# Patient Record
Sex: Male | Born: 1985 | Race: Black or African American | Hispanic: No | Marital: Single | Smoking: Current every day smoker
Health system: Southern US, Community
[De-identification: ages and names within clinical notes are randomized; demographics above are authoritative.]

## PROBLEM LIST (undated history)

## (undated) HISTORY — PX: HERNIA REPAIR: SHX51

---
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
## 2006-08-02 IMAGING — CR DG CHEST 2V
1 series · 2 of 2 positions shown · non-contrast
Comparison: none

REASON FOR EXAM: Cough
COMMENTS:

PROCEDURE:     DXR - DXR CHEST PA (OR AP) AND LATERAL  - December 18, 2005  [DATE]
RESULT:     Lungs are clear.  The cardiac silhouette and visualized bony
skeleton are unremarkable.

[Series 1: view not recorded · 0.17mm/px · 2 of 2 slices shown]
[im 1/2]
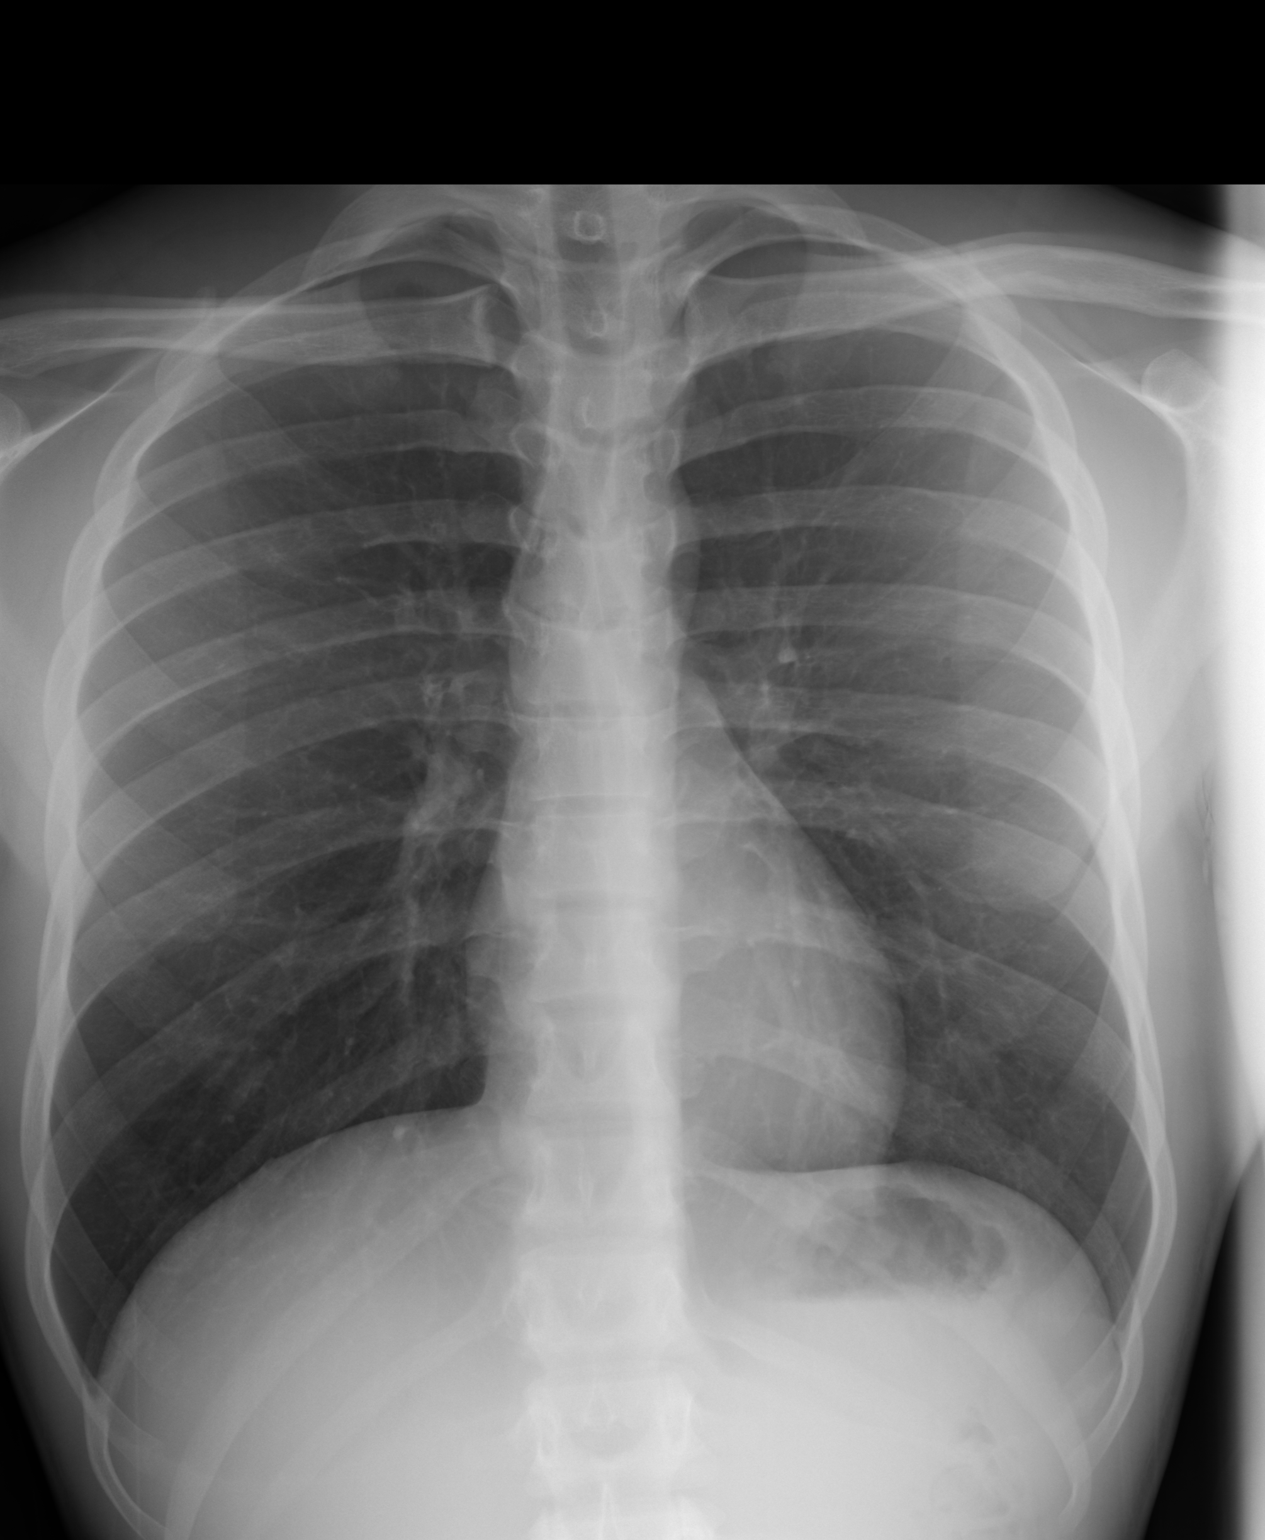
[im 2/2]
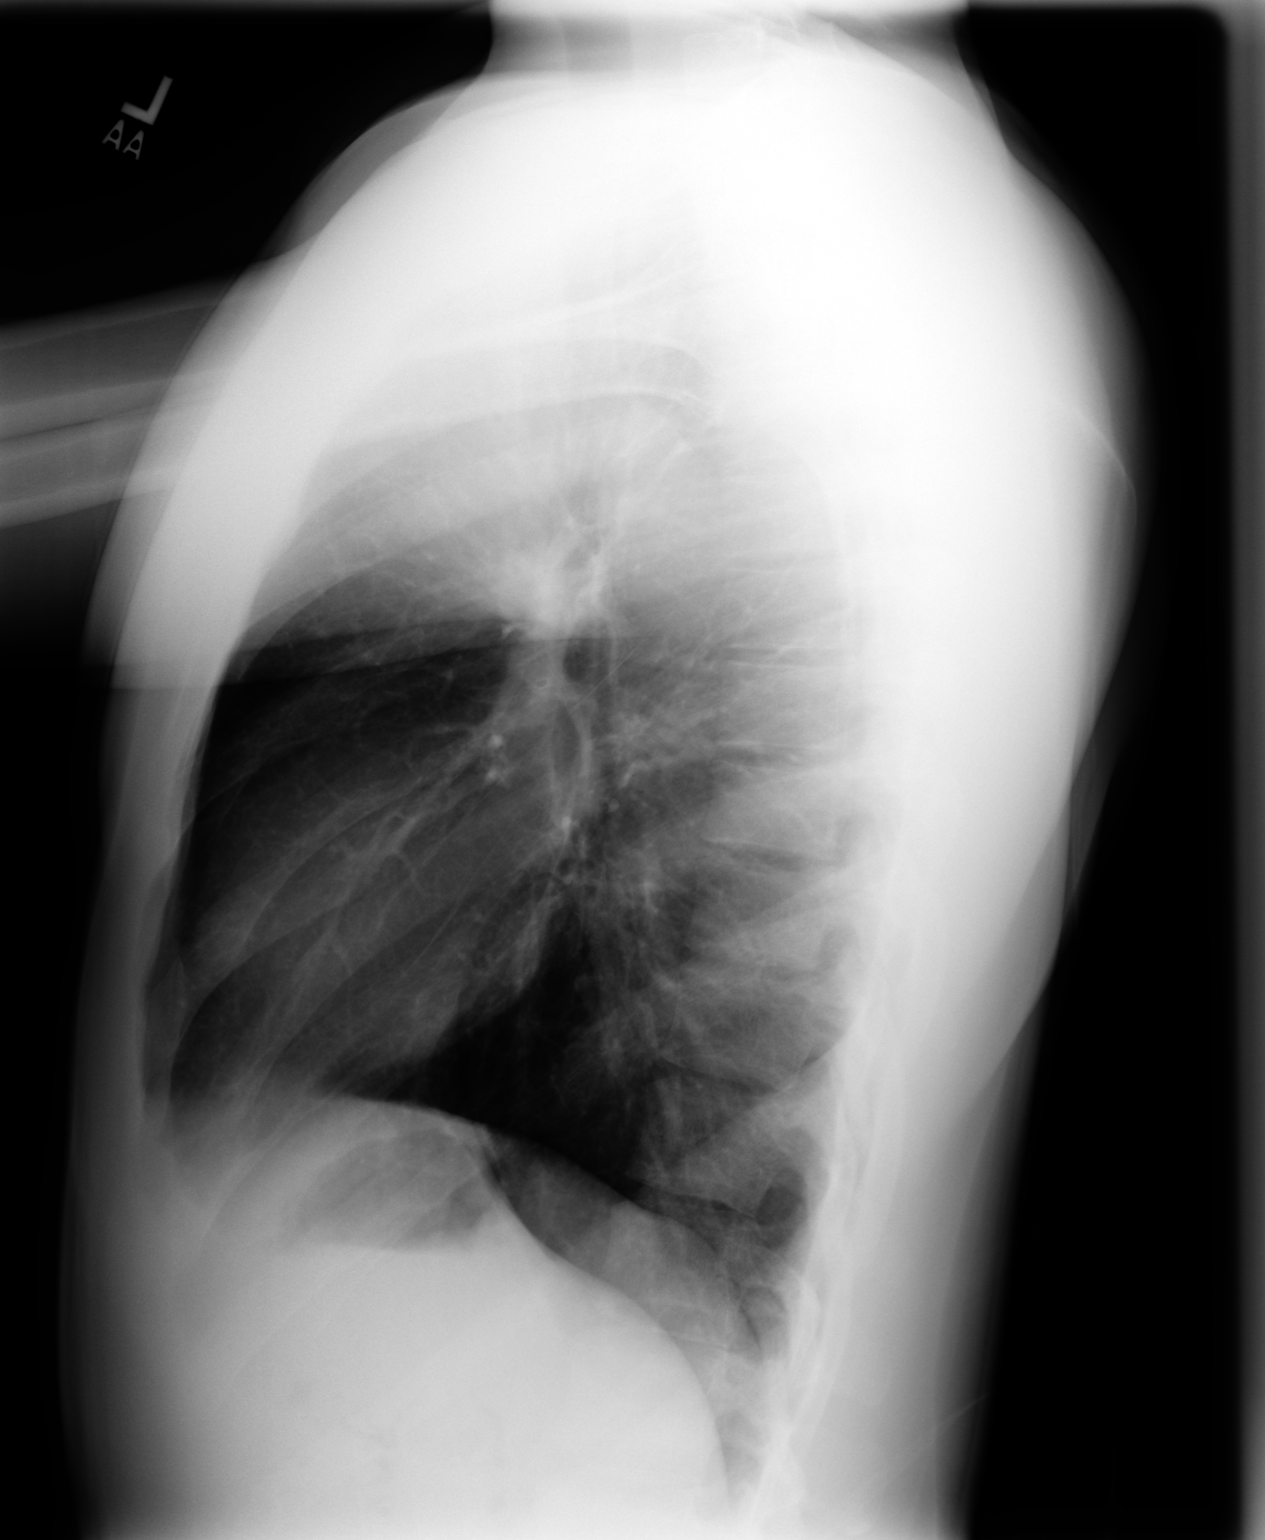

[2 of 2 positions shown; findings below may reference images not displayed]

IMPRESSION: Chest radiograph without evidence of acute cardiopulmonary disease.

## 2006-11-09 ENCOUNTER — Emergency Department: Payer: Self-pay | Admitting: Emergency Medicine

## 2009-07-22 ENCOUNTER — Emergency Department: Payer: Self-pay | Admitting: Unknown Physician Specialty

## 2016-06-13 ENCOUNTER — Emergency Department: Payer: Self-pay

## 2016-06-13 ENCOUNTER — Encounter: Payer: Self-pay | Admitting: Emergency Medicine

## 2016-06-13 DIAGNOSIS — K644 Residual hemorrhoidal skin tags: Secondary | ICD-10-CM | POA: Insufficient documentation

## 2016-06-13 DIAGNOSIS — F17210 Nicotine dependence, cigarettes, uncomplicated: Secondary | ICD-10-CM | POA: Insufficient documentation

## 2016-06-13 LAB — COMPREHENSIVE METABOLIC PANEL
ALK PHOS: 89 U/L (ref 38–126)
ALT: 15 U/L — AB (ref 17–63)
ANION GAP: 8 (ref 5–15)
AST: 28 U/L (ref 15–41)
Albumin: 3.8 g/dL (ref 3.5–5.0)
BUN: 11 mg/dL (ref 6–20)
CALCIUM: 9.6 mg/dL (ref 8.9–10.3)
CHLORIDE: 110 mmol/L (ref 101–111)
CO2: 24 mmol/L (ref 22–32)
CREATININE: 1.07 mg/dL (ref 0.61–1.24)
Glucose, Bld: 103 mg/dL — ABNORMAL HIGH (ref 65–99)
Potassium: 4 mmol/L (ref 3.5–5.1)
Sodium: 142 mmol/L (ref 135–145)
Total Bilirubin: <0.1 mg/dL — ABNORMAL LOW (ref 0.3–1.2)
Total Protein: 6.7 g/dL (ref 6.5–8.1)

## 2016-06-13 LAB — URINALYSIS COMPLETE WITH MICROSCOPIC (ARMC ONLY)
BILIRUBIN URINE: NEGATIVE
Bacteria, UA: NONE SEEN
Glucose, UA: NEGATIVE mg/dL
HGB URINE DIPSTICK: NEGATIVE
LEUKOCYTES UA: NEGATIVE
NITRITE: NEGATIVE
PH: 5 (ref 5.0–8.0)
Protein, ur: 30 mg/dL — AB
SPECIFIC GRAVITY, URINE: 1.03 (ref 1.005–1.030)
Squamous Epithelial / LPF: NONE SEEN

## 2016-06-13 LAB — LIPASE, BLOOD: LIPASE: 50 U/L (ref 11–51)

## 2016-06-13 LAB — CBC
HCT: 40.5 % (ref 40.0–52.0)
Hemoglobin: 13.5 g/dL (ref 13.0–18.0)
MCH: 29.9 pg (ref 26.0–34.0)
MCHC: 33.3 g/dL (ref 32.0–36.0)
MCV: 89.7 fL (ref 80.0–100.0)
PLATELETS: 193 10*3/uL (ref 150–440)
RBC: 4.52 MIL/uL (ref 4.40–5.90)
RDW: 13.4 % (ref 11.5–14.5)
WBC: 8.6 10*3/uL (ref 3.8–10.6)

## 2016-06-13 NOTE — ED Triage Notes (Signed)
Patient ambulatory to triage with steady gait, without difficulty or distress noted; pt reports rectal pain and abd pain x 3 days; last BM 3 days ago after no BM x week

## 2016-06-14 ENCOUNTER — Emergency Department
Admission: EM | Admit: 2016-06-14 | Discharge: 2016-06-14 | Disposition: A | Discharge status: Home / Self Care | Payer: Self-pay | Attending: Emergency Medicine | Admitting: Emergency Medicine

## 2016-06-14 DIAGNOSIS — K644 Residual hemorrhoidal skin tags: Secondary | ICD-10-CM

## 2016-06-14 MED ORDER — TUCKS 50 % EX PADS: 1.0000 "application " | MEDICATED_PAD | CUTANEOUS | 2 refills | Status: AC | PRN

## 2016-06-14 MED ORDER — PRAMOXINE HCL 1 % RE FOAM
1.0000 | Freq: Three times a day (TID) | RECTAL | 0 refills | Status: AC | PRN
Start: 2016-06-14 — End: ?

## 2016-06-14 MED ORDER — DOCUSATE SODIUM 100 MG PO CAPS: 200.0000 mg | ORAL_CAPSULE | Freq: Two times a day (BID) | ORAL | 1 refills | Status: AC

## 2016-06-14 NOTE — ED Notes (Signed)
Pt lying on stretcher, no apparent distress. Dr. Scotty CourtStafford at bedside  States 3-4 days abd pain that is sharp, rectum pain with bleeding. States pain with BM. States has had this before when he was in prison.   Hx of hernia.   Denies vomiting, eating/drinking well.

## 2016-06-14 NOTE — ED Provider Notes (Signed)
[facility] Emergency Department Provider Note  ____________________________________________  Time seen: Approximately 1:27 AM  I have reviewed the triage vital signs and the nursing notes.   HISTORY  Chief Complaint Abdominal Pain    HPI Mario King is a 30 y.o. male who complains of rectal pain for the past 3 days. He notes that he has had chronic issues with constipation, continuing over the last couple weeks. After a hard BM 3 days ago started having rectal pain with some bleeding. Denies nausea or vomiting. Eating and drinking normally. No fevers or chills.  Denies any history of receptive anal intercourse   History reviewed. No pertinent past medical history.   There are no active problems to display for this patient.    Past Surgical History:  Procedure Laterality Date  . HERNIA REPAIR       Prior to Admission medications   Medication Sig Start Date End Date Taking? Authorizing Provider  docusate sodium (COLACE) 100 MG capsule Take 2 capsules (200 mg total) by mouth 2 (two) times daily. 06/14/16   Sharman CheekPhillip Rayquan Amrhein, MD  pramoxine (PROCTOFOAM) 1 % foam Place 1 application rectally 3 (three) times daily as needed for itching or hemorrhoids. 06/14/16   Sharman CheekPhillip Ethelyne Erich, MD  Witch Hazel (TUCKS) 50 % PADS Apply 1 application topically every 2 (two) hours as needed. 06/14/16   Sharman CheekPhillip Tejal Monroy, MD     Allergies Patient has no allergy information on record.   No family history on file.  Social History Social History  Substance Use Topics  . Smoking status: Current Every Day Smoker    Packs/day: 0.50    Types: Cigarettes  . Smokeless tobacco: Never Used  . Alcohol use Yes     Comment: 2-3times/week    Review of Systems  Constitutional:   No fever or chills.  Cardiovascular:   No chest pain. Respiratory:   No dyspnea or cough. Gastrointestinal:   Negative for abdominal pain, vomiting and diarrhea.  10-point ROS otherwise  negative.  ____________________________________________   PHYSICAL EXAM:  VITAL SIGNS: ED Triage Vitals  Enc Vitals Group     BP 06/13/16 2318 (!) 147/100     Pulse Rate 06/13/16 2318 84     Resp 06/13/16 2318 18     Temp 06/13/16 2318 98 F (36.7 C)     Temp Source 06/13/16 2318 Oral     SpO2 06/13/16 2318 97 %     Weight 06/13/16 2318 165 lb (74.8 kg)     Height 06/13/16 2318 6\' 4"  (1.93 m)     Head Circumference --      Peak Flow --      Pain Score 06/13/16 2319 10     Pain Loc --      Pain Edu? --      Excl. in GC? --     Vital signs reviewed, nursing assessments reviewed.   Constitutional:   Alert and oriented. Well appearing and in no distress. Eyes:   No scleral icterus. No conjunctival pallor. PERRL. EOMI.  No nystagmus. ENT   Head:   Normocephalic and atraumatic.   Nose:   No congestion/rhinnorhea. No septal hematoma   Mouth/Throat:   MMM, no pharyngeal erythema. No peritonsillar mass.    Neck:   No stridor. No SubQ emphysema. No meningismus. Hematological/Lymphatic/Immunilogical:   No cervical lymphadenopathy. Cardiovascular:   RRR. Symmetric bilateral radial and DP pulses.  No murmurs.  Respiratory:   Normal respiratory effort without tachypnea nor retractions. Breath  sounds are clear and equal bilaterally. No wheezes/rales/rhonchi. Gastrointestinal:   Soft and nontender. Non distended. There is no CVA tenderness.  No rebound, rigidity, or guarding.Rectal exam performed with nurse Jae DireKate at the bedside. Large inflamed external hemorrhoids. Scant amount of red blood present but no active bleeding. Tender to the touch. Genitourinary:   deferred Musculoskeletal:   Nontender with normal range of motion in all extremities. No joint effusions.  No lower extremity tenderness.  No edema. Neurologic:   Normal speech and language.  CN 2-10 normal. Motor grossly intact. No gross focal neurologic deficits are appreciated.  Skin:    Skin is warm, dry and  intact. No rash noted.  No petechiae, purpura, or bullae.  ____________________________________________    LABS (pertinent positives/negatives) (all labs ordered are listed, but only abnormal results are displayed) Labs Reviewed  COMPREHENSIVE METABOLIC PANEL - Abnormal; Notable for the following:       Result Value   Glucose, Bld 103 (*)    ALT 15 (*)    Total Bilirubin <0.1 (*)    All other components within normal limits  URINALYSIS COMPLETEWITH MICROSCOPIC ([facility] ONLY) - Abnormal; Notable for the following:    Color, Urine YELLOW (*)    APPearance CLEAR (*)    Ketones, ur TRACE (*)    Protein, ur 30 (*)    All other components within normal limits  LIPASE, BLOOD  CBC   ____________________________________________   EKG    ____________________________________________    RADIOLOGY  X-ray abdomen unremarkable. Nonobstructive.  ____________________________________________   PROCEDURES Procedures  ____________________________________________   INITIAL IMPRESSION / ASSESSMENT AND PLAN / ED COURSE  Pertinent labs & imaging results that were available during my care of the patient were reviewed by me and considered in my medical decision making (see chart for details).  Patient well appearing no acute distress, presents with some rectal discomfort related to large inflamed hemorrhoid. Colace, Tucks, Proctofoam, follow-up with surgery.Considering the patient's symptoms, medical history, and physical examination today, I have low suspicion for cholecystitis or biliary pathology, pancreatitis, perforation or bowel obstruction, hernia, intra-abdominal abscess, AAA or dissection, volvulus or intussusception, mesenteric ischemia, or appendicitis.       Clinical Course    ____________________________________________   FINAL CLINICAL IMPRESSION(S) / ED DIAGNOSES  Final diagnoses:  Inflamed external hemorrhoid       Portions of this note were generated with  dragon dictation software. Dictation errors may occur despite best attempts at proofreading.    Sharman CheekPhillip Lilias Lorensen, MD 06/14/16 [phone]

## 2017-01-26 IMAGING — CR DG ABDOMEN 1V
1 series · 2 of 2 positions shown · non-contrast
Comparison: None.

CLINICAL DATA: Rectal pain and abdominal pain for 3 days. Last
bowel movement 3 days ago.

EXAM:
ABDOMEN - 1 VIEW

[Series 2: t abdomen supine · 0.14mm/px · 2 of 2 slices shown]
[im 1/2]
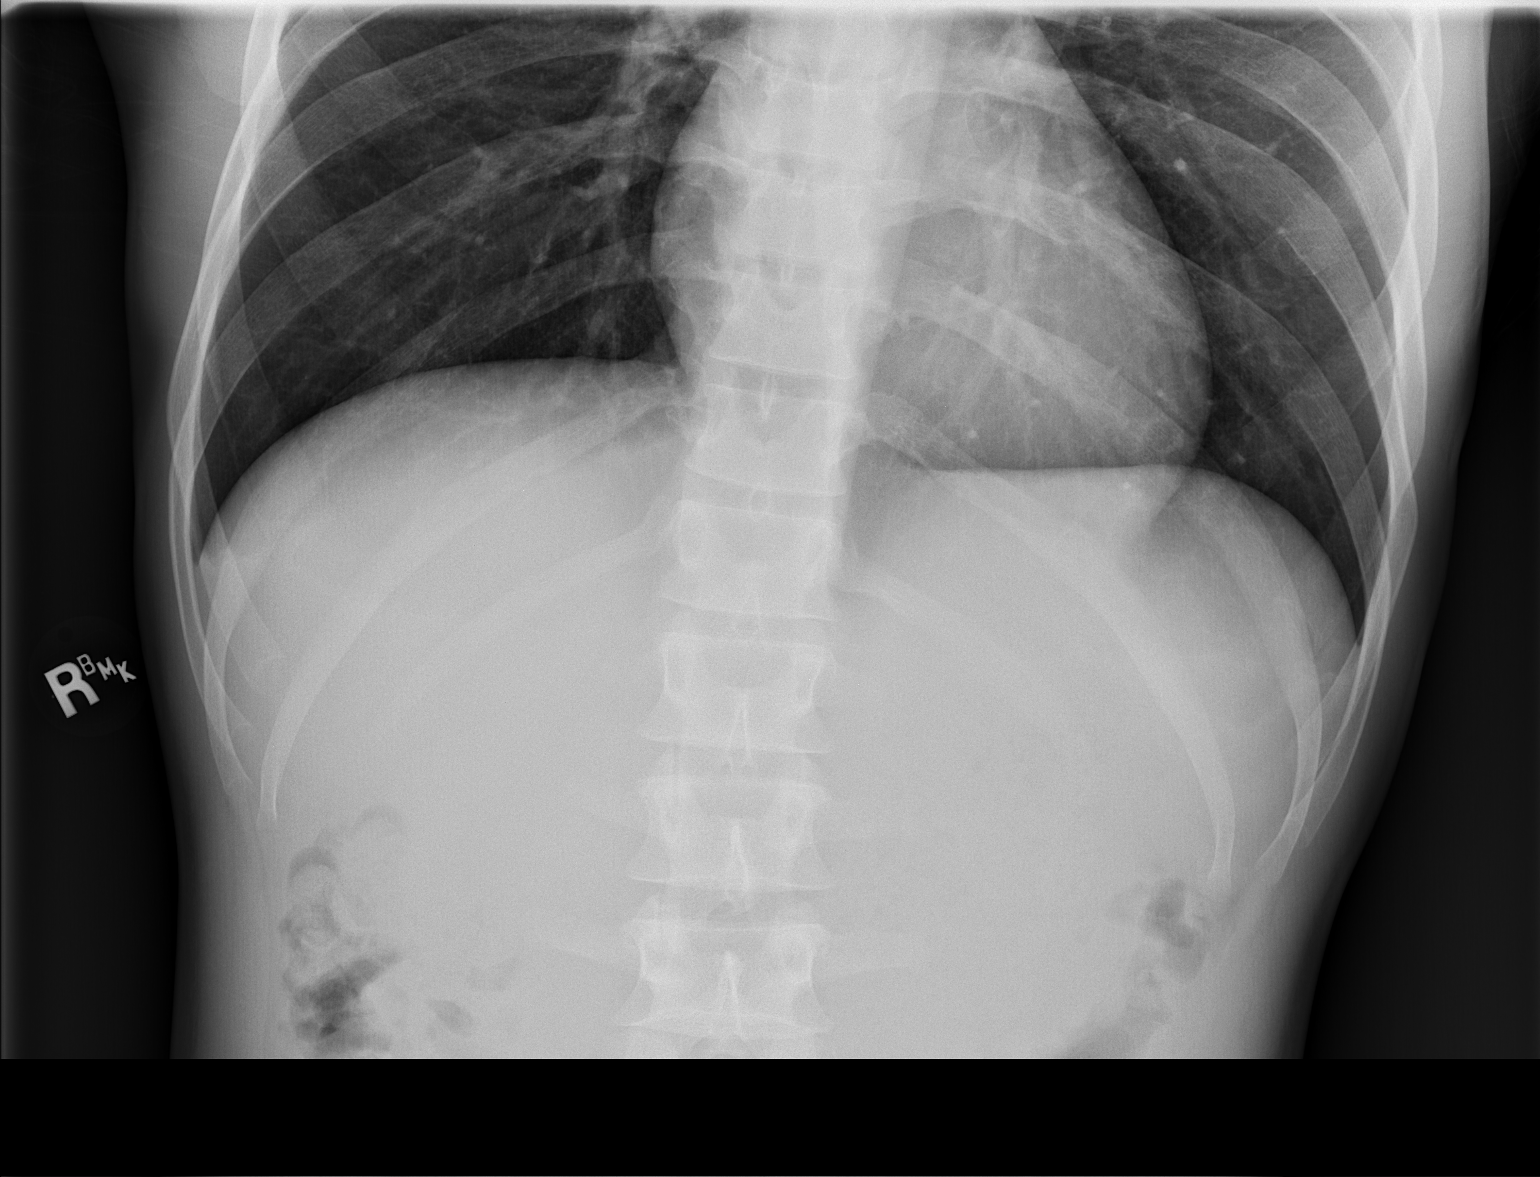
[im 2/2]
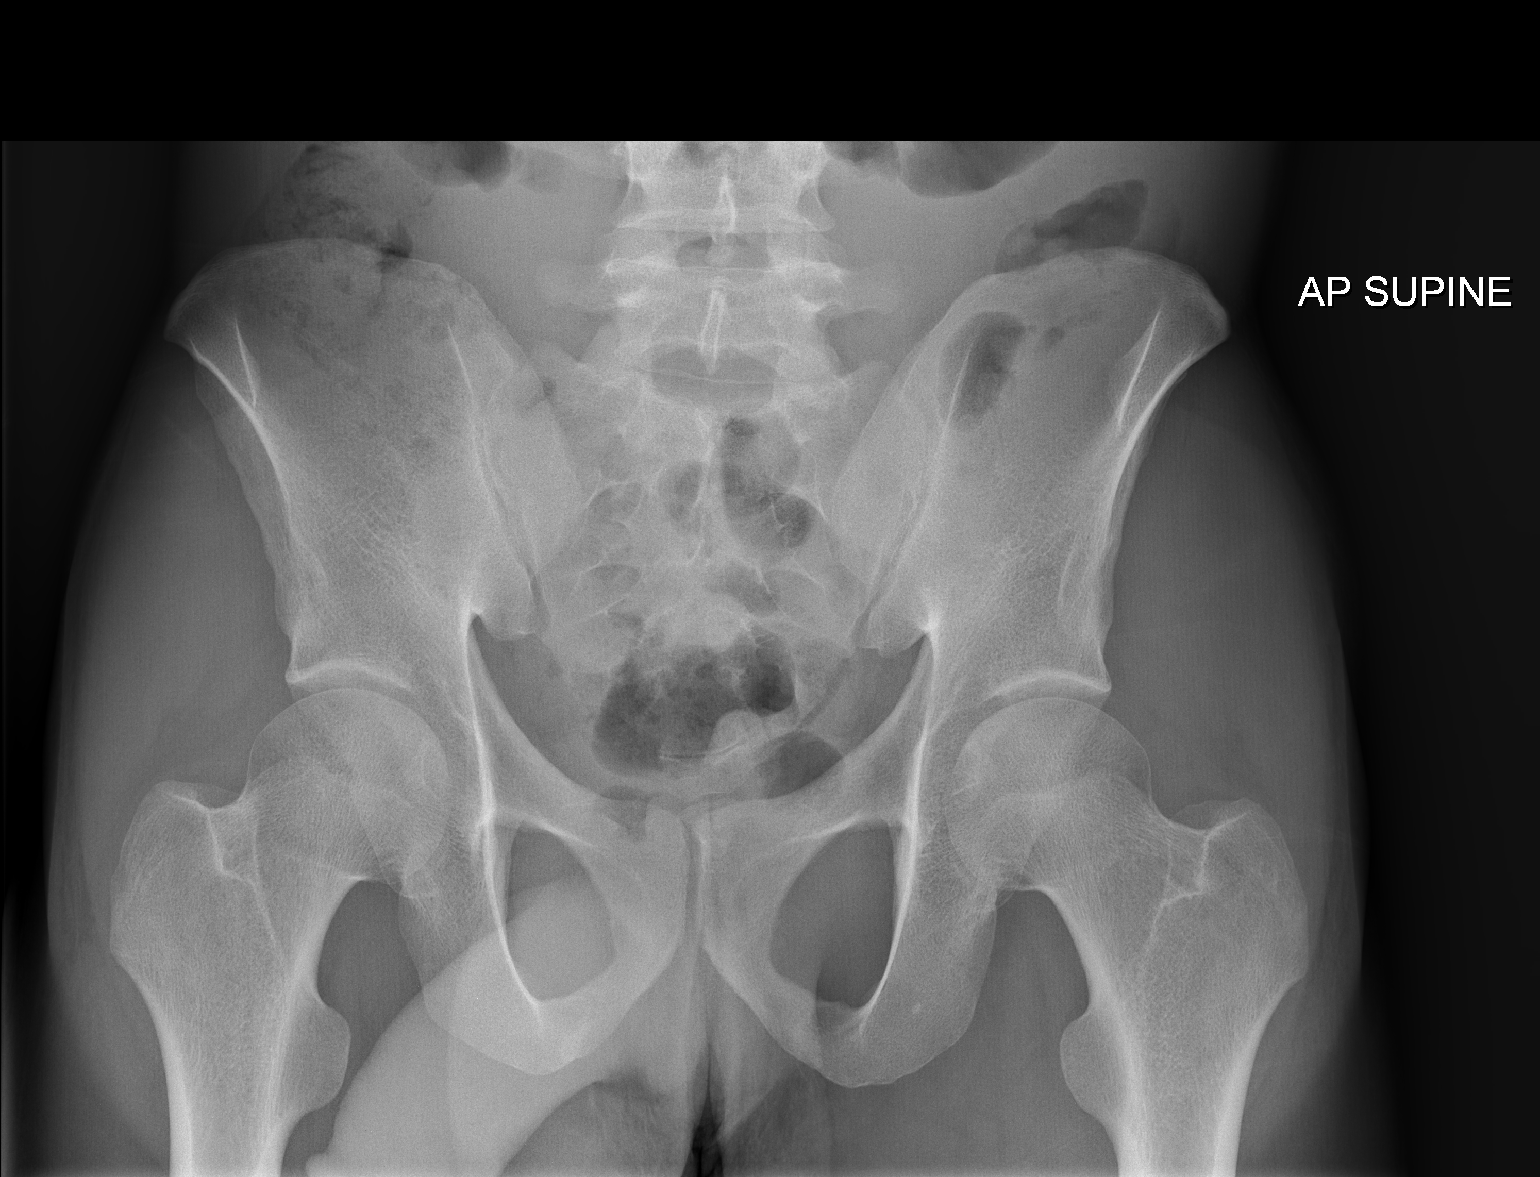

[2 of 2 positions shown; findings below may reference images not displayed]

FINDINGS: Scattered gas and stool in the colon. No small or large bowel
distention. No radiopaque stones. Visualized bones appear intact.
IMPRESSION: Nonobstructive bowel gas pattern.
